# Patient Record
Sex: Female | Born: 1970 | Race: Black or African American | Hispanic: No | Marital: Single | Smoking: Never smoker
Health system: Southern US, Community
[De-identification: ages and names within clinical notes are randomized; demographics above are authoritative.]

## PROBLEM LIST (undated history)

## (undated) DIAGNOSIS — I671 Cerebral aneurysm, nonruptured: Secondary | ICD-10-CM

## (undated) DIAGNOSIS — I10 Essential (primary) hypertension: Secondary | ICD-10-CM

## (undated) HISTORY — DX: Cerebral aneurysm, nonruptured: I67.1

## (undated) HISTORY — DX: Essential (primary) hypertension: I10

## (undated) HISTORY — PX: BRAIN SURGERY: SHX531

---

## 2021-06-11 ENCOUNTER — Emergency Department (HOSPITAL_COMMUNITY): Payer: Self-pay

## 2021-06-11 ENCOUNTER — Encounter (HOSPITAL_COMMUNITY): Payer: Self-pay

## 2021-06-11 ENCOUNTER — Other Ambulatory Visit: Payer: Self-pay

## 2021-06-11 ENCOUNTER — Emergency Department (HOSPITAL_COMMUNITY)
Admission: EM | Admit: 2021-06-11 | Discharge: 2021-06-11 | Disposition: A | Discharge status: Home / Self Care | Payer: Self-pay | Attending: Student | Admitting: Student

## 2021-06-11 DIAGNOSIS — R519 Headache, unspecified: Secondary | ICD-10-CM | POA: Insufficient documentation

## 2021-06-11 MED ORDER — PROCHLORPERAZINE EDISYLATE 10 MG/2ML IJ SOLN
10.0000 mg | Freq: Once | INTRAMUSCULAR | Status: AC
  Administered 2021-06-11: 10 mg via INTRAMUSCULAR
  Filled 2021-06-11: qty 2

## 2021-06-11 MED ORDER — KETOROLAC TROMETHAMINE 15 MG/ML IJ SOLN: 15.0000 mg | Freq: Once | INTRAMUSCULAR | Status: DC

## 2021-06-11 MED ORDER — DIPHENHYDRAMINE HCL 50 MG/ML IJ SOLN
12.5000 mg | Freq: Once | INTRAMUSCULAR | Status: AC
  Administered 2021-06-11: 12.5 mg via INTRAMUSCULAR
  Filled 2021-06-11: qty 1

## 2021-06-11 NOTE — ED Provider Notes (Signed)
[facility]-EMERGENCY DEPT Provider Note   CSN: [id number] Arrival date & time: 06/11/21  1222     History Chief Complaint  Patient presents with  . Headache    Heidi Navarro is a 50 y.o. female with history of brain surgery in 2016 at [facility] who presents to the emergency room today for a constant left-sided headache that began yesterday.  She states that she suffers from headaches on a regular basis and she states that this feels similar to her typical headaches however it is much more severe.  She rates her headache an 8 out of 10 in severity.  She took some Tylenol which offered little improvement with her headache prior to arrival.  She denies any visual disturbances, weakness/numbness to her upper and lower extremities, loss of consciousness, nausea, vomiting, abdominal pain, chest pain, shortness of breath, diarrhea, leg pain, leg swelling.  The history is provided by the patient. No language interpreter was used.  Headache     History reviewed. No pertinent past medical history.  There are no problems to display for this patient.   Past Surgical History:  Procedure Laterality Date  . BRAIN SURGERY       OB History   No obstetric history on file.     History reviewed. No pertinent family history.  Social History   Tobacco Use  . Smoking status: Unknown    Home Medications Prior to Admission medications   Not on File    Allergies    Iodinated diagnostic agents, Iodine, Penicillins, and Shellfish allergy  Review of Systems   Review of Systems  Neurological:  Positive for headaches.  All other systems reviewed and are negative.  Physical Exam Updated Vital Signs BP 124/83   Pulse (!) 51   Temp 98.5 F (36.9 C)   Resp 18   SpO2 100%   Physical Exam Constitutional:      General: She is not in acute distress.    Appearance: Normal appearance.  HENT:     Head: Normocephalic and atraumatic.  Eyes:     General:        Right eye:  No discharge.        Left eye: No discharge.     Extraocular Movements: Extraocular movements intact.     Right eye: No nystagmus.     Left eye: No nystagmus.     Conjunctiva/sclera: Conjunctivae normal.     Pupils: Pupils are equal, round, and reactive to light.  Cardiovascular:     Comments: Regular rate and rhythm.  S1/S2 are distinct without any evidence of murmur, rubs, or gallops.  Radial pulses are 2+ bilaterally.  Dorsalis pedis pulses are 2+ bilaterally.  No evidence of pedal edema. Pulmonary:     Comments: Clear to auscultation bilaterally.  Normal effort.  No respiratory distress.  No evidence of wheezes, rales, or rhonchi heard throughout. Abdominal:     General: Abdomen is flat. Bowel sounds are normal. There is no distension.     Tenderness: There is no abdominal tenderness. There is no guarding or rebound.  Musculoskeletal:        General: Normal range of motion.     Cervical back: Neck supple.  Skin:    General: Skin is warm and dry.     Findings: No rash.  Neurological:     General: No focal deficit present.     Mental Status: She is alert.     Coordination: Romberg sign negative. Coordination normal.  Comments: Cranial nerves II through XII are intact.  5/5 strength to the upper and lower extremities.  Normal sensation to the upper and lower extremities.  Psychiatric:        Mood and Affect: Mood normal.        Behavior: Behavior normal.    ED Results / Procedures / Treatments   Labs (all labs ordered are listed, but only abnormal results are displayed) Labs Reviewed - No data to display  EKG None  Radiology CT HEAD WO CONTRAST ( )  Result Date: 06/11/2021 CLINICAL DATA:  Headache, chronic, new features or increased frequency. EXAM: CT HEAD WITHOUT CONTRAST TECHNIQUE: Contiguous axial images were obtained from the base of the skull through the vertex without intravenous contrast. COMPARISON:  No pertinent prior exams available for comparison. FINDINGS:  Brain: Streak and beam hardening artifact arising from embolization coil material in the left paraclinoid region. Cerebral volume is normal. There is no acute intracranial hemorrhage. No demarcated cortical infarct. No extra-axial fluid collection. No evidence of an intracranial mass. No midline shift. Vascular: Streak and beam hardening artifact arising from embolization coil material in the left paraclinoid region. Otherwise, no hyperdense vessel. Skull: Normal. Negative for fracture or focal lesion. Sinuses/Orbits: Visualized orbits show no acute finding. Chronic medially displaced fracture deformity of the right lamina papyracea. No significant paranasal sinus disease at the imaged levels. IMPRESSION: No evidence of acute intracranial abnormality. Streak and beam hardening artifact arising from embolization coil material in the left paraclinoid region. Correlate with the procedural history. Electronically Signed   By: Jackey Loge D.O.   On: 06/11/2021 14:13    Procedures Procedures   Medications Ordered in ED Medications  prochlorperazine (COMPAZINE) injection 10 mg (10 mg Intramuscular Given 06/11/21 1733)  diphenhydrAMINE (BENADRYL) injection 12.5 mg (12.5 mg Intramuscular Given 06/11/21 1733)    ED Course  I have reviewed the triage vital signs and the nursing notes.  Pertinent labs & imaging results that were available during my care of the patient were reviewed by me and considered in my medical decision making (see chart for details).  Clinical Course as of 06/11/21 1843  Wed Jun 11, 2021  1800 I discussed this case with my attending physician who cosigned this note including patient's presenting symptoms, physical exam, and planned diagnostics and interventions. Attending physician stated agreement with plan or made changes to plan which were implemented.      [CF]    Clinical Course User Index [CF] Jolyn Lent   MDM Rules/Calculators/A&P                           Heidi Navarro is a 50 y.o. female who presents to the emergency department today for for evaluation of headache.  Presentation is likely a benign headache.  She has no headache red flags.  Neurological exam is without evidence of meningismus, altered mental status, focal neurological findings so I believe this patient for meningitis, and encephalitis, and stroke.  Presentation is not consistent with acute intracranial bleed at this time.  No evidence of trauma.  CT head was ordered in triage which was normal.  Given the clinical scenario, she is much improved after migraine cocktail.  Strict return precautions given.  We will have her follow-up with her primary care provider within next week for further evaluation.  Final Clinical Impression(s) / ED Diagnoses Final diagnoses:  Nonintractable headache, unspecified chronicity pattern, unspecified headache type    Rx /  DC Orders ED Discharge Orders     None        Jolyn Lent 06/11/21 1843    Glendora Score, MD 06/11/21 [phone]

## 2021-06-11 NOTE — ED Triage Notes (Signed)
Headache x1 day worse upon standing   Hx of brain surgery 2016 with intermittent headaches since.

## 2021-06-11 NOTE — Discharge Instructions (Signed)
You were seen and evaluated in the emergency department for further evaluation of headache.  As we discussed, your imaging revealed no serious problems at the moment.  Please return to the emergency department if you experience worsening headache that is not typical for her typical headaches, weakness/numbness of your upper or lower extremities, fever that would not go away Tylenol or ibuprofen, slurred speech, loss of consciousness, or any other concerns you might have.  Please schedule an appointment with your neurologist within the next week.  If you are unable to schedule an appoint with them please follow-up with your primary care provider within the next week for further evaluation.

## 2021-06-11 NOTE — ED Provider Notes (Signed)
Emergency Medicine Provider Triage Evaluation Note  Heidi Navarro , a 50 y.o. female  was evaluated in triage.  Pt complains of headaches.  Patient reports a history of brain surgery at [facility] in 2016.  She states that since she has been experiencing intermittent headaches.  She states that she began experiencing a headache to the crown and left temple that began last night.  States it is worse than normal.  Describes her pain as stabbing.  No visual changes, numbness, weakness, chest pain, shortness of breath.  Physical Exam  BP 127/62   Pulse (!) 52   Temp 98.5 F (36.9 C)   Resp 18   SpO2 97%  Gen:   Awake, no distress   Resp:  Normal effort  MSK:   Moves extremities without difficulty  Other:    Medical Decision Making  Medically screening exam initiated at 12:59 PM.  Appropriate orders placed.  Blaise Candela was informed that the remainder of the evaluation will be completed by another provider, this initial triage assessment does not replace that evaluation, and the importance of remaining in the ED until their evaluation is complete.   Placido Sou, PA-C 06/11/21 1300    Edwin Dada P, DO 06/12/21 [phone]

## 2021-08-12 ENCOUNTER — Emergency Department (HOSPITAL_COMMUNITY)
Admission: EM | Admit: 2021-08-12 | Discharge: 2021-08-13 | Disposition: A | Discharge status: Home / Self Care | Payer: Self-pay | Attending: Emergency Medicine | Admitting: Emergency Medicine

## 2021-08-12 ENCOUNTER — Emergency Department (HOSPITAL_COMMUNITY): Payer: Self-pay

## 2021-08-12 ENCOUNTER — Other Ambulatory Visit: Payer: Self-pay

## 2021-08-12 ENCOUNTER — Encounter (HOSPITAL_COMMUNITY): Payer: Self-pay

## 2021-08-12 DIAGNOSIS — R0602 Shortness of breath: Secondary | ICD-10-CM | POA: Insufficient documentation

## 2021-08-12 DIAGNOSIS — R079 Chest pain, unspecified: Secondary | ICD-10-CM | POA: Insufficient documentation

## 2021-08-12 DIAGNOSIS — Z20822 Contact with and (suspected) exposure to covid-19: Secondary | ICD-10-CM | POA: Insufficient documentation

## 2021-08-12 DIAGNOSIS — R059 Cough, unspecified: Secondary | ICD-10-CM | POA: Insufficient documentation

## 2021-08-12 LAB — CBC WITH DIFFERENTIAL/PLATELET
Abs Immature Granulocytes: 0 10*3/uL (ref 0.00–0.07)
Basophils Absolute: 0 10*3/uL (ref 0.0–0.1)
Basophils Relative: 1 %
Eosinophils Absolute: 0.2 10*3/uL (ref 0.0–0.5)
Eosinophils Relative: 4 %
HCT: 38.5 % (ref 36.0–46.0)
Hemoglobin: 12.3 g/dL (ref 12.0–15.0)
Immature Granulocytes: 0 %
Lymphocytes Relative: 59 %
Lymphs Abs: 3.3 10*3/uL (ref 0.7–4.0)
MCH: 29.4 pg (ref 26.0–34.0)
MCHC: 31.9 g/dL (ref 30.0–36.0)
MCV: 91.9 fL (ref 80.0–100.0)
Monocytes Absolute: 0.2 10*3/uL (ref 0.1–1.0)
Monocytes Relative: 4 %
Neutro Abs: 1.8 10*3/uL (ref 1.7–7.7)
Neutrophils Relative %: 32 %
Platelets: 213 10*3/uL (ref 150–400)
RBC: 4.19 MIL/uL (ref 3.87–5.11)
RDW: 15.2 % (ref 11.5–15.5)
WBC: 5.6 10*3/uL (ref 4.0–10.5)
nRBC: 0 % (ref 0.0–0.2)

## 2021-08-12 LAB — COMPREHENSIVE METABOLIC PANEL
ALT: 18 U/L (ref 0–44)
AST: 18 U/L (ref 15–41)
Albumin: 4 g/dL (ref 3.5–5.0)
Alkaline Phosphatase: 99 U/L (ref 38–126)
Anion gap: 5 (ref 5–15)
BUN: 14 mg/dL (ref 6–20)
CO2: 28 mmol/L (ref 22–32)
Calcium: 8.9 mg/dL (ref 8.9–10.3)
Chloride: 108 mmol/L (ref 98–111)
Creatinine, Ser: 0.93 mg/dL (ref 0.44–1.00)
GFR, Estimated: >60 mL/min (ref >60)
Glucose, Bld: 98 mg/dL (ref 70–99)
Potassium: 3.6 mmol/L (ref 3.5–5.1)
Sodium: 141 mmol/L (ref 135–145)
Total Bilirubin: 0.4 mg/dL (ref 0.3–1.2)
Total Protein: 7.8 g/dL (ref 6.5–8.1)

## 2021-08-12 LAB — LIPASE, BLOOD: Lipase: 19 U/L (ref 11–51)

## 2021-08-12 LAB — TROPONIN I (HIGH SENSITIVITY)
Troponin I (High Sensitivity): <2 ng/L (ref <18)
Troponin I (High Sensitivity): <2 ng/L (ref <18)

## 2021-08-12 LAB — HCG, QUANTITATIVE, PREGNANCY: hCG, Beta Chain, Quant, S: 3 m[IU]/mL (ref <5)

## 2021-08-12 LAB — BRAIN NATRIURETIC PEPTIDE: B Natriuretic Peptide: 104.6 pg/mL — ABNORMAL HIGH (ref 0.0–100.0)

## 2021-08-12 LAB — D-DIMER, QUANTITATIVE: D-Dimer, Quant: 1.72 ug/mL-FEU — ABNORMAL HIGH (ref 0.00–0.50)

## 2021-08-12 MED ORDER — DIPHENHYDRAMINE HCL 25 MG PO CAPS: 50.0000 mg | ORAL_CAPSULE | Freq: Once | ORAL | Status: DC

## 2021-08-12 MED ORDER — HYDROCORTISONE SOD SUC (PF) 250 MG IJ SOLR
200.0000 mg | Freq: Once | INTRAMUSCULAR | Status: AC
  Administered 2021-08-12: 200 mg via INTRAVENOUS
  Filled 2021-08-12: qty 200

## 2021-08-12 MED ORDER — DIPHENHYDRAMINE HCL 50 MG/ML IJ SOLN: 50.0000 mg | Freq: Once | INTRAMUSCULAR | Status: DC

## 2021-08-12 MED ORDER — DIPHENHYDRAMINE HCL 50 MG/ML IJ SOLN
50.0000 mg | Freq: Once | INTRAMUSCULAR | Status: AC
  Administered 2021-08-12: 50 mg via INTRAVENOUS
  Filled 2021-08-12: qty 1

## 2021-08-12 MED ORDER — DIPHENHYDRAMINE HCL 25 MG PO CAPS: 50.0000 mg | ORAL_CAPSULE | Freq: Once | ORAL | Status: AC

## 2021-08-12 NOTE — ED Provider Notes (Signed)
Emergency Medicine Provider Triage Evaluation Note  Heidi Navarro , a 50 y.o. female  was evaluated in triage.  Pt complains of shortness of breath that has been worse over the last 2 days.  She states that her CPAP machine has not been working properly and her daughter's noticed that she has been struggling to breathe.  Shortness of breath is primarily with exertion.  Also having some chest pain yesterday.  None today.  Reports associated leg swelling.  Review of Systems  Positive:  Negative: Fever, chills   Physical Exam  BP (!) 149/77 (BP Location: Left Arm)   Pulse (!) 59   Temp 97.8 F (36.6 C) (Oral)   Resp 20   SpO2 100%  Gen:   Awake, no distress   Resp:  Normal effort  MSK:   Moves extremities without difficulty  Other:  1+ pitting edema bilaterally up to the proximal shin  Medical Decision Making  Medically screening exam initiated at 4:38 PM.  Appropriate orders placed.  Sofiya Tayler was informed that the remainder of the evaluation will be completed by another provider, this initial triage assessment does not replace that evaluation, and the importance of remaining in the ED until their evaluation is complete.     Honor Loh Liberal, PA-C 08/12/21 1640    Mancel Bale, MD 08/13/21 [phone]

## 2021-08-12 NOTE — ED Triage Notes (Signed)
Pt c/o shortness of breath that started today. Pt thinks it may be due to her cpap machine not working properly last night.

## 2021-08-12 NOTE — ED Provider Notes (Signed)
[facility] DEPT Provider Note   CSN: [id number] Arrival date & time: 08/12/21  1614     History Chief Complaint  Patient presents with   Shortness of Breath    Heidi Navarro is a 50 y.o. female  Presents emergency department with a chief plaint of shortness of breath and chest pain.  Patient reports that she has had shortness of breath constantly over the last 2 days.  Shortness of breath has been worse with exertion.  Patient reports that she has been wheezing over this time as well.  Patient reports that her CPAP patient has not been working properly and she is concerned this may be causing her shortness of breath.  Patient reports that she has had chest pain intermittently over the last 3 days.  Chest pain is midsternal and does not radiate.  Patient describes pain as sharp.  Patient denies any aggravating factors.  Reports that pain has come on randomly.  No associated nausea, vomiting, diaphoresis, shortness of breath, lightheadedness, dizziness, syncope.  Patient endorses nonproductive cough and rhinorrhea.  Patient denies any fever, chills, palpitations, orthopnea, leg swelling or tenderness, abdominal pain, nausea, vomiting, hemoptysis, history of PE/DVT, history of cancer, surgery last 4 weeks, hormone therapy.     Shortness of Breath Associated symptoms: cough   Associated symptoms: no abdominal pain, no chest pain, no fever, no headaches, no neck pain, no rash, no sore throat and no vomiting       History reviewed. No pertinent past medical history.  There are no problems to display for this patient.   Past Surgical History:  Procedure Laterality Date   BRAIN SURGERY       OB History   No obstetric history on file.     No family history on file.  Social History   Tobacco Use   Smoking status: Unknown    Home Medications Prior to Admission medications   Not on File    Allergies    Iodinated diagnostic agents,  Iodine, Penicillins, and Shellfish allergy  Review of Systems   Review of Systems  Constitutional:  Negative for chills and fever.  HENT:  Positive for rhinorrhea. Negative for congestion and sore throat.   Eyes:  Negative for visual disturbance.  Respiratory:  Positive for cough and shortness of breath.   Cardiovascular:  Negative for chest pain.  Gastrointestinal:  Negative for abdominal pain, diarrhea, nausea and vomiting.  Genitourinary:  Negative for difficulty urinating, dysuria, hematuria and urgency.  Musculoskeletal:  Negative for back pain and neck pain.  Skin:  Negative for color change and rash.  Neurological:  Negative for dizziness, syncope, light-headedness and headaches.  Psychiatric/Behavioral:  Negative for confusion.    Physical Exam Updated Vital Signs BP (!) 149/77 (BP Location: Left Arm)   Pulse (!) 59   Temp 97.8 F (36.6 C) (Oral)   Resp 20   SpO2 100%   Physical Exam Vitals and nursing note reviewed.  Constitutional:      General: She is not in acute distress.    Appearance: She is morbidly obese. She is not ill-appearing, toxic-appearing or diaphoretic.  HENT:     Head: Normocephalic.  Eyes:     General: No scleral icterus.       Right eye: No discharge.        Left eye: No discharge.     Conjunctiva/sclera: Conjunctivae normal.  Cardiovascular:     Rate and Rhythm: Normal rate.  Pulmonary:     Effort:  Pulmonary effort is normal. No tachypnea, bradypnea or respiratory distress.     Breath sounds: Normal breath sounds. No stridor. No wheezing, rhonchi or rales.     Comments: Speaks in full complete sentences without difficulty Chest:     Chest wall: Tenderness present. No mass, lacerations, deformity, swelling or crepitus.     Comments: Chest pain reproduced with palpation Abdominal:     General: Abdomen is protuberant. There is no distension. There are no signs of injury.     Palpations: Abdomen is soft. There is no mass or pulsatile mass.      Tenderness: There is no abdominal tenderness. There is no guarding or rebound.  Musculoskeletal:     Cervical back: Neck supple.     Right lower leg: No swelling, deformity, lacerations, tenderness or bony tenderness. No edema.     Left lower leg: No swelling, deformity, lacerations, tenderness or bony tenderness. No edema.  Skin:    General: Skin is warm and dry.  Neurological:     General: No focal deficit present.     Mental Status: She is alert.  Psychiatric:        Behavior: Behavior is cooperative.    ED Results / Procedures / Treatments   Labs (all labs ordered are listed, but only abnormal results are displayed) Labs Reviewed  BRAIN NATRIURETIC PEPTIDE - Abnormal; Notable for the following components:      Result Value   B Natriuretic Peptide 104.6 (*)    All other components within normal limits  D-DIMER, QUANTITATIVE - Abnormal; Notable for the following components:   D-Dimer, Quant 1.72 (*)    All other components within normal limits  COMPREHENSIVE METABOLIC PANEL  CBC WITH DIFFERENTIAL/PLATELET  LIPASE, BLOOD  HCG, QUANTITATIVE, PREGNANCY  TROPONIN I (HIGH SENSITIVITY)  TROPONIN I (HIGH SENSITIVITY)    EKG EKG Interpretation  Date/Time:  Tuesday August 12 2021 16:26:47 EST Ventricular Rate:  57 PR Interval:  168 QRS Duration: 80 QT Interval:  423 QTC Calculation: 412 R Axis:   57 Text Interpretation: Sinus rhythm Normal ECG No previous tracing Confirmed by Gwyneth Sprout ([id number]) on 08/12/2021 7:30:24 PM  Radiology DG Chest 2 View  Result Date: 08/12/2021 CLINICAL DATA:  Shortness of breath EXAM: CHEST - 2 VIEW COMPARISON:  None. FINDINGS: The heart size and mediastinal contours are within normal limits. Both lungs are clear. The visualized skeletal structures are unremarkable. IMPRESSION: No active cardiopulmonary disease. Electronically Signed   By: Jasmine Pang M.D.   On: 08/12/2021 17:04    Procedures Procedures   Medications Ordered in  ED Medications  diphenhydrAMINE (BENADRYL) capsule 50 mg (has no administration in time range)    Or  diphenhydrAMINE (BENADRYL) injection 50 mg (has no administration in time range)  hydrocortisone sodium succinate (SOLU-CORTEF) injection 200 mg (200 mg Intravenous Given 08/12/21 2021)    ED Course  I have reviewed the triage vital signs and the nursing notes.  Pertinent labs & imaging results that were available during my care of the patient were reviewed by me and considered in my medical decision making (see chart for details).    MDM Rules/Calculators/A&P                           Alert 50 year old female no acute distress, nontoxic-appearing.  Presents to ED with chief complaint of shortness of breath and chest pain.  Reports that she has had intermittent chest pain over the last 3 days.  Chest pain comes on randomly and is not brought on by any aggravating factors.  Patient has not tried any allergies to alleviate her symptoms.  Shortness of breath has been constant over the last 2 days.  Patient reports that she feels a she has had some wheezing.  Lungs clear to auscultation bilaterally.  Patient able to speak in full complete sentences without difficulty.  Oxygen saturation 100% on room air.  Patient has reproduction of chest pain with palpation to mid sternum.  Will obtain ACS work-up, BMP, and D-dimer to look for possible cause of patient's chest pain and shortness of breath.  EKG shows normal sinus rhythm Chest x-ray shows no active cardiopulmonary disease Revealed troponins unremarkable BNP slightly elevated at 104.6 Low suspicion for ACS or acute CHF as cause of patient's chest pain shortness of breath at this time.  CMP, CBC, and lipase are unremarkable.  D-dimer found to be elevated at 1.72.  Previous documented D-dimer had been within normal limits.  Will obtain CTA to evaluate for possible PE.  Patient does have allergy to contrast dye and will require steroids and  Benadryl prior to CTA.  CTA pending at this time.  Give shows no PE patient able to stand and ambulate without difficulty plan to discharge home with PCP follow-up.  Patient care transferred to [facility] at the end of my shift. Patient presentation, ED course, and plan of care discussed with review of all pertinent labs and imaging. Please see his/her note for further details regarding further ED course and disposition.   Final Clinical Impression(s) / ED Diagnoses Final diagnoses:  None    Rx / DC Orders ED Discharge Orders     None        Dyann Ruddle 08/12/21 2329    Blanchie Dessert, MD 08/14/21 [phone]

## 2021-08-13 ENCOUNTER — Encounter (HOSPITAL_COMMUNITY): Payer: Self-pay

## 2021-08-13 ENCOUNTER — Emergency Department (HOSPITAL_COMMUNITY): Payer: Self-pay

## 2021-08-13 LAB — RESP PANEL BY RT-PCR (FLU A&B, COVID) ARPGX2
Influenza A by PCR: NEGATIVE
Influenza B by PCR: NEGATIVE
SARS Coronavirus 2 by RT PCR: NEGATIVE

## 2021-08-13 MED ORDER — IOHEXOL 350 MG/ML SOLN
80.0000 mL | Freq: Once | INTRAVENOUS | Status: AC | PRN
  Administered 2021-08-13: 80 mL via INTRAVENOUS

## 2021-08-13 MED ORDER — ALBUTEROL SULFATE HFA 108 (90 BASE) MCG/ACT IN AERS
2.0000 | INHALATION_SPRAY | Freq: Once | RESPIRATORY_TRACT | Status: AC
  Administered 2021-08-13: 2 via RESPIRATORY_TRACT
  Filled 2021-08-13: qty 6.7

## 2021-08-13 MED ORDER — FLUTICASONE PROPIONATE 50 MCG/ACT NA SUSP: 1.0000 | Freq: Every day | NASAL | 0 refills | Status: AC

## 2021-08-13 MED ORDER — BENZONATATE 100 MG PO CAPS: 100.0000 mg | ORAL_CAPSULE | Freq: Three times a day (TID) | ORAL | 0 refills | Status: AC

## 2021-08-13 NOTE — Discharge Instructions (Addendum)
You were seen in the ER today for trouble breathing with chest pain.  Your work-up was overall reassuring.  Your CT scan did not show findings of a blood clot.  Your heart enzymes did not have findings of a heart attack.  We are sending you home with the following medicines to try to help with your symptoms - Flonase: Use 1 spray per nostril daily as needed for congestion - Tessalon: Take 1 tablet as needed every 8 hours for cough. - Albuterol inhaler: Use 1 to 2 puffs every 4-6 hours as needed for trouble breathing/wheezing  We have prescribed you new medication(s) today. Discuss the medications prescribed today with your pharmacist as they can have adverse effects and interactions with your other medicines including over the counter and prescribed medications. Seek medical evaluation if you start to experience new or abnormal symptoms after taking one of these medicines, seek care immediately if you start to experience difficulty breathing, feeling of your throat closing, facial swelling, or rash as these could be indications of a more serious allergic reaction  Please follow-up with primary care within 3 days.  Return to the emergency department for new or worsening symptoms including but not limited to new or worsening pain, fever, increased work of breathing, coughing up blood, passing out, or any other concerns.

## 2021-08-13 NOTE — ED Provider Notes (Signed)
23:30: Assumed care of patient from PA Badalamente pending CTA and disposition.   Please see prior provider note for full H&P.  Briefly patient is a 50 yo female who presented with dyspnea & chest pain. Also has had rhinorrhea, non productive cough and some reports of wheezing.   Patient's work-up has been reviewed: CBC, CMP, lipase, pregnancy test, troponins, COVID/flu testing: Negative BMP: Minimally elevated D-dimer: Elevated  CT angio: No evidence of pulmonary embolism. Mild dependent atelectatic changes  EKG without acute stemi, trops flat- doubt ACS.  CTA negative for PE, fluid overload, or pneumonia.  Unclear definitive etiology of patient's sxs, however she is not hypoxic or in respiratory distress, overall seems reasonable for discharge. Has had congestion & cough. Possibly viral vs. Allergic sxs. Covid/flu negative. Patient reports wheezing at home, lungs clear in the ED. Will provide supportive care with PCP follow up. I discussed results, treatment plan, need for follow-up, and return precautions with the patient. Provided opportunity for questions, patient confirmed understanding and is in agreement with plan.    Results for orders placed or performed during the hospital encounter of 08/12/21  Resp Panel by RT-PCR (Flu A&B, Covid) Nasopharyngeal Swab   Specimen: Nasopharyngeal Swab; Nasopharyngeal(NP) swabs in vial transport medium  Result Value Ref Range   SARS Coronavirus 2 by RT PCR NEGATIVE NEGATIVE   Influenza A by PCR NEGATIVE NEGATIVE   Influenza B by PCR NEGATIVE NEGATIVE  Comprehensive metabolic panel  Result Value Ref Range   Sodium 141 135 - 145 mmol/L   Potassium 3.6 3.5 - 5.1 mmol/L   Chloride 108 98 - 111 mmol/L   CO2 28 22 - 32 mmol/L   Glucose, Bld 98 70 - 99 mg/dL   BUN 14 6 - 20 mg/dL   Creatinine, Ser 5.46 0.44 - 1.00 mg/dL   Calcium 8.9 8.9 - 56.8 mg/dL   Total Protein 7.8 6.5 - 8.1 g/dL   Albumin 4.0 3.5 - 5.0 g/dL   AST 18 15 - 41 U/L   ALT 18 0  - 44 U/L   Alkaline Phosphatase 99 38 - 126 U/L   Total Bilirubin 0.4 0.3 - 1.2 mg/dL   GFR, Estimated >12 >75 mL/min   Anion gap 5 5 - 15  CBC with Differential  Result Value Ref Range   WBC 5.6 4.0 - 10.5 K/uL   RBC 4.19 3.87 - 5.11 MIL/uL   Hemoglobin 12.3 12.0 - 15.0 g/dL   HCT 17.0 01.7 - 49.4 %   MCV 91.9 80.0 - 100.0 fL   MCH 29.4 26.0 - 34.0 pg   MCHC 31.9 30.0 - 36.0 g/dL   RDW 49.6 75.9 - 16.3 %   Platelets 213 150 - 400 K/uL   nRBC 0.0 0.0 - 0.2 %   Neutrophils Relative % 32 %   Neutro Abs 1.8 1.7 - 7.7 K/uL   Lymphocytes Relative 59 %   Lymphs Abs 3.3 0.7 - 4.0 K/uL   Monocytes Relative 4 %   Monocytes Absolute 0.2 0.1 - 1.0 K/uL   Eosinophils Relative 4 %   Eosinophils Absolute 0.2 0.0 - 0.5 K/uL   Basophils Relative 1 %   Basophils Absolute 0.0 0.0 - 0.1 K/uL   Immature Granulocytes 0 %   Abs Immature Granulocytes 0.00 0.00 - 0.07 K/uL  Brain natriuretic peptide  Result Value Ref Range   B Natriuretic Peptide 104.6 (H) 0.0 - 100.0 pg/mL  D-dimer, quantitative  Result Value Ref Range   D-Dimer, Quant  1.72 (H) 0.00 - 0.50 ug/mL-FEU  Lipase, blood  Result Value Ref Range   Lipase 19 11 - 51 U/L  hCG, quantitative, pregnancy  Result Value Ref Range   hCG, Beta Chain, Quant, S 3 <5 mIU/mL  Troponin I (High Sensitivity)  Result Value Ref Range   Troponin I (High Sensitivity) <2 <18 ng/L  Troponin I (High Sensitivity)  Result Value Ref Range   Troponin I (High Sensitivity) <2 <18 ng/L   DG Chest 2 View  Result Date: 08/12/2021 CLINICAL DATA:  Shortness of breath EXAM: CHEST - 2 VIEW COMPARISON:  None. FINDINGS: The heart size and mediastinal contours are within normal limits. Both lungs are clear. The visualized skeletal structures are unremarkable. IMPRESSION: No active cardiopulmonary disease. Electronically Signed   By: Jasmine Pang M.D.   On: 08/12/2021 17:04   CT Angio Chest PE W and/or Wo Contrast  Result Date: 08/13/2021 CLINICAL DATA:  Shortness  of breath for 2 days, negative COVID test, initial encounter EXAM: CT ANGIOGRAPHY CHEST WITH CONTRAST TECHNIQUE: Multidetector CT imaging of the chest was performed using the standard protocol during bolus administration of intravenous contrast. Multiplanar CT image reconstructions and MIPs were obtained to evaluate the vascular anatomy. CONTRAST:  20mL OMNIPAQUE IOHEXOL 350 MG/ML SOLN COMPARISON:  Chest x-ray from the previous day. FINDINGS: Cardiovascular: Thoracic aorta shows no aneurysmal dilatation. No cardiac enlargement is noted. Pulmonary artery shows no branching pattern bilaterally. No definitive filling defect to suggest pulmonary embolism is noted. Mediastinum/Nodes: Thoracic inlet is within normal limits. No sizable hilar or mediastinal adenopathy is noted. The esophagus as visualized shows a small sliding-type hiatal hernia. Lungs/Pleura: Mild dependent atelectatic changes are seen. No focal confluent infiltrate is noted. Upper Abdomen: Visualized upper abdomen is within normal limits. Musculoskeletal: Degenerative changes of the thoracic spine are seen. No rib abnormality is noted. Review of the MIP images confirms the above findings. IMPRESSION: No evidence of pulmonary embolism. Mild dependent atelectatic changes. Electronically Signed   By: Alcide Clever M.D.   On: 08/13/2021 00:57      Marifer Hurd, Pleas Koch, PA-C 08/13/21 0128    Geoffery Lyons, MD 08/13/21 [phone]

## 2021-08-19 ENCOUNTER — Telehealth: Payer: Self-pay

## 2021-08-19 NOTE — Telephone Encounter (Signed)
Copied from CRM [id number]. Topic: General - Other >> Aug 13, 2021 12:29 PM Jaquita Rector A wrote: Reason for CRM: Patient called in needing to speak to Laural Benes and Mikle Bosworth would like to get a better understanding of how the program works for her to be able to see the Dr without having to pay out of pocket and also being able to see the neurologist on a regular basis. Please call Ph# [phone]

## 2021-08-21 NOTE — Telephone Encounter (Signed)
I return Pt call, LVM explaining her how the program works

## 2021-09-02 ENCOUNTER — Telehealth: Payer: Self-pay

## 2021-09-02 NOTE — Telephone Encounter (Signed)
I return Pt call, LVM inform that she need to see the provider first before she can apply for the financial program @  clinic also that the [facility] Card does not cover hospital or medical bills but is she apply for the [facility] financial they can help, again she need to see the Provider first that is the rule of the clinic

## 2021-09-02 NOTE — Telephone Encounter (Signed)
Copied from CRM [id number]. Topic: Appointment Scheduling - Scheduling Inquiry for Clinic >> Sep 02, 2021 12:26 PM Pawlus, Gifford Shave wrote: Reason for CRM: Pt needed an appt for financial assistance and paying medical bills, pt has new pt appt on 10/24/21 but wanted some financial advice as well.

## 2021-09-08 ENCOUNTER — Telehealth: Payer: Self-pay | Admitting: *Deleted

## 2021-09-08 NOTE — Telephone Encounter (Signed)
Copied from CRM [id number]. Topic: General - Other >> Sep 08, 2021 12:53 PM Marylen Ponto wrote: Reason for CRM: Pt returned call to [facility]. Pt asked that Mikle Bosworth return her call at [phone]  CALLED PT BACK, NO ANSWER

## 2021-10-24 ENCOUNTER — Ambulatory Visit: Payer: Self-pay | Admitting: Internal Medicine

## 2022-04-10 ENCOUNTER — Encounter (HOSPITAL_COMMUNITY): Payer: Self-pay

## 2022-04-10 ENCOUNTER — Other Ambulatory Visit: Payer: Self-pay

## 2022-04-10 ENCOUNTER — Emergency Department (HOSPITAL_COMMUNITY)
Admission: EM | Admit: 2022-04-10 | Discharge: 2022-04-10 | Disposition: A | Discharge status: Home / Self Care | Payer: Self-pay | Attending: Emergency Medicine | Admitting: Emergency Medicine

## 2022-04-10 ENCOUNTER — Emergency Department (HOSPITAL_COMMUNITY): Payer: Self-pay

## 2022-04-10 DIAGNOSIS — R079 Chest pain, unspecified: Secondary | ICD-10-CM

## 2022-04-10 DIAGNOSIS — R519 Headache, unspecified: Secondary | ICD-10-CM | POA: Insufficient documentation

## 2022-04-10 DIAGNOSIS — R0789 Other chest pain: Secondary | ICD-10-CM | POA: Insufficient documentation

## 2022-04-10 LAB — BASIC METABOLIC PANEL
Anion gap: 6 (ref 5–15)
BUN: 12 mg/dL (ref 6–20)
CO2: 25 mmol/L (ref 22–32)
Calcium: 9.1 mg/dL (ref 8.9–10.3)
Chloride: 111 mmol/L (ref 98–111)
Creatinine, Ser: 1.1 mg/dL — ABNORMAL HIGH (ref 0.44–1.00)
GFR, Estimated: >60 mL/min (ref >60)
Glucose, Bld: 113 mg/dL — ABNORMAL HIGH (ref 70–99)
Potassium: 3.4 mmol/L — ABNORMAL LOW (ref 3.5–5.1)
Sodium: 142 mmol/L (ref 135–145)

## 2022-04-10 LAB — CBC
HCT: 37.2 % (ref 36.0–46.0)
Hemoglobin: 12 g/dL (ref 12.0–15.0)
MCH: 29.2 pg (ref 26.0–34.0)
MCHC: 32.3 g/dL (ref 30.0–36.0)
MCV: 90.5 fL (ref 80.0–100.0)
Platelets: 220 10*3/uL (ref 150–400)
RBC: 4.11 MIL/uL (ref 3.87–5.11)
RDW: 15.2 % (ref 11.5–15.5)
WBC: 5.8 10*3/uL (ref 4.0–10.5)
nRBC: 0 % (ref 0.0–0.2)

## 2022-04-10 LAB — TROPONIN I (HIGH SENSITIVITY): Troponin I (High Sensitivity): <2 ng/L (ref <18)

## 2022-04-10 MED ORDER — PANTOPRAZOLE SODIUM 20 MG PO TBEC: 20.0000 mg | DELAYED_RELEASE_TABLET | Freq: Every day | ORAL | 0 refills | Status: AC

## 2022-04-10 MED ORDER — PANTOPRAZOLE SODIUM 20 MG PO TBEC
20.0000 mg | DELAYED_RELEASE_TABLET | Freq: Once | ORAL | Status: AC
  Administered 2022-04-10: 20 mg via ORAL
  Filled 2022-04-10: qty 1

## 2022-04-10 MED ORDER — SUCRALFATE 1 G PO TABS: 1.0000 g | ORAL_TABLET | Freq: Three times a day (TID) | ORAL | 0 refills | Status: AC

## 2022-04-10 NOTE — ED Provider Notes (Signed)
Heidi Navarro Provider Note   CSN: [id number] Arrival date & time: 04/10/22  1647     History  Chief Complaint  Patient presents with   Back Pain   Chest Pain   Hypertension    Heidi Navarro is a 51 y.o. female.  Patient here primarily with chest discomfort.  Reflux type symptoms when she eats.  Has no abdominal pain.  She has been having some headaches but none currently.  She wants the have her head looked at because she has a history of aneurysm.  She states that she had her aneurysm looked at recently with some imaging with her neurosurgeon about a month ago that was normal.  She has no current chest pain, abdominal pain, headache, shortness of breath at this time.  She states that every time she eats it makes her symptoms worse.  Denies any infectious symptoms.  The history is provided by the patient.       Home Medications Prior to Admission medications   Medication Sig Start Date End Date Taking? Authorizing Provider  pantoprazole (PROTONIX) 20 MG tablet Take 1 tablet (20 mg total) by mouth daily. 04/10/22 05/10/22 Yes Heidi Giraldo, DO  sucralfate (CARAFATE) 1 g tablet Take 1 tablet (1 g total) by mouth 4 (four) times daily -  with meals and at bedtime for 14 days. 04/10/22 04/24/22 Yes Heidi Cerone, DO  atenolol (TENORMIN) 50 MG tablet Take 1 tablet by mouth daily. 04/14/21   Heidi Navarro  benzonatate (TESSALON) 100 MG capsule Take 1 capsule (100 mg total) by mouth every 8 (eight) hours. 08/13/21   Heidi Navarro, Heidi R, PA-C  fluticasone (FLONASE) 50 MCG/ACT nasal spray Place 1 spray into both nostrils daily. 08/13/21   Heidi Navarro, Heidi R, PA-C  hydrochlorothiazide (HYDRODIURIL) 25 MG tablet Take 0.5 tablets by mouth daily. 04/17/21   Heidi Navarro      Allergies    Iodinated contrast media, Iodine, Penicillins, and Shellfish allergy    Review of Systems   Review of Systems  Physical Exam Updated Vital  Signs BP (!) 156/87   Pulse (!) 59   Temp 98 F (36.7 C) (Oral)   Resp 20   Ht 5\' 5"  (1.651 m)   Wt 136.1 kg   SpO2 100%   BMI 49.92 kg/m  Physical Exam Vitals and nursing note reviewed.  Constitutional:      General: She is not in acute distress.    Appearance: She is well-developed.  HENT:     Head: Normocephalic and atraumatic.  Eyes:     Extraocular Movements: Extraocular movements intact.     Conjunctiva/sclera: Conjunctivae normal.     Pupils: Pupils are equal, round, and reactive to light.  Cardiovascular:     Rate and Rhythm: Normal rate and regular rhythm.     Pulses:          Radial pulses are 2+ on the right side and 2+ on the left side.     Heart sounds: No murmur heard. Pulmonary:     Effort: Pulmonary effort is normal. No respiratory distress.     Breath sounds: Normal breath sounds.  Abdominal:     Palpations: Abdomen is soft.     Tenderness: There is no abdominal tenderness.  Musculoskeletal:        General: No swelling. Normal range of motion.     Cervical back: Neck supple.  Skin:    General: Skin is warm and dry.  Capillary Refill: Capillary refill takes less than 2 seconds.  Neurological:     Mental Status: She is alert.     Comments: 5+ out of 5 strength throughout, normal sensation, no drift, normal finger-nose-finger, normal speech  Psychiatric:        Mood and Affect: Mood normal.     ED Results / Procedures / Treatments   Labs (all labs ordered are listed, but only abnormal results are displayed) Labs Reviewed  BASIC METABOLIC PANEL - Abnormal; Notable for the following components:      Result Value   Potassium 3.4 (*)    Glucose, Bld 113 (*)    Creatinine, Ser 1.10 (*)    All other components within normal limits  CBC  TROPONIN I (HIGH SENSITIVITY)    EKG EKG Interpretation  Date/Time:  Friday April 10 2022 18:11:34 EDT Ventricular Rate:  81 PR Interval:  152 QRS Duration: 76 QT Interval:  384 QTC Calculation: 446 Navarro  Axis:   41 Text Interpretation: Sinus rhythm Confirmed by Heidi Navarro ([id number]) on 04/10/2022 8:12:47 PM  Radiology CT HEAD WO CONTRAST ( )  Result Date: 04/10/2022 CLINICAL DATA:  New or worsening headache.  Left chest pain EXAM: CT HEAD WITHOUT CONTRAST TECHNIQUE: Contiguous axial images were obtained from the base of the skull through the vertex without intravenous contrast. RADIATION DOSE REDUCTION: This exam was performed according to the departmental dose-optimization program which includes automated exposure control, adjustment of the mA and/or kV according to patient size and/or use of iterative reconstruction technique. COMPARISON:  06/11/2021 FINDINGS: Brain: No evidence of acute infarction, hemorrhage, hydrocephalus, extra-axial collection or mass lesion/mass effect. Vascular: Metallic structure with streak artifact demonstrated in the left parasellar region consistent with embolization coils. Skull: Calvarium appears intact. Sinuses/Orbits: Paranasal sinuses and mastoid air cells are clear. Old fracture deformity of the right medial orbital wall. Other: No significant change since prior study. IMPRESSION: No acute intracranial abnormalities. Aneurysm coils demonstrated in the region of the left middle cerebral artery origin. Old fracture deformity of the right medial orbital wall. No change since prior study. Electronically Signed   By: Heidi Navarro M.D.   On: 04/10/2022 21:24   DG Chest Port 1 View  Result Date: 04/10/2022 CLINICAL DATA:  Chest pain. EXAM: PORTABLE CHEST 1 VIEW COMPARISON:  08/12/2021 FINDINGS: The heart size and mediastinal contours are within normal limits. Lungs are suboptimally inflated. Both lungs are clear. The visualized skeletal structures are unremarkable. IMPRESSION: No active disease. Electronically Signed   By: Heidi Navarro M.D.   On: 04/10/2022 18:24    Procedures Procedures    Medications Ordered in ED Medications  pantoprazole (PROTONIX) EC tablet  20 mg (has no administration in time range)    ED Course/ Medical Decision Making/ A&P                           Medical Decision Making Amount and/or Complexity of Data Reviewed Radiology: ordered.  Risk Prescription drug management.   Heidi Navarro is here with primarily chest pain.  History of cerebral aneurysm status posttreatment.  She has been having some mild headaches here and there recently.  She would like a head CT.  She has no current headache.  No neurologic symptoms.  She states that she recently had her aneurysm evaluated by her surgeon and was unremarkable.  Shared decision was made to get a head CT.  Otherwise it sounds like she has reflux type symptoms.  Differential diagnosis is likely reflux.  Will check EKG troponin and chest x-ray to further evaluate for ACS.  I have no concern for PE.  CBC, BMP, troponin, chest x-ray also obtained.  Per my review interpretation of labs is no significant anemia, electrolyte abnormality, kidney injury, leukocytosis.  Troponin is normal.  Chest x-ray without signs of pneumonia or pneumothorax.  She is not having any abdominal pain.  Have no concern for cholecystitis or pancreatitis.  She states that she mostly gets symptoms when she eats.  Overall we will prescribe Protonix and Carafate.  CT of the head was unremarkable.  Discharged in good condition.  This chart was dictated using voice recognition software.  Despite best efforts to proofread,  errors can occur which can change the documentation meaning.         Final Clinical Impression(s) / ED Diagnoses Final diagnoses:  Nonspecific chest pain  Nonintractable headache, unspecified chronicity pattern, unspecified headache type    Rx / DC Orders ED Discharge Orders          Ordered    pantoprazole (PROTONIX) 20 MG tablet  Daily        04/10/22 2135    sucralfate (CARAFATE) 1 g tablet  3 times daily with meals & bedtime        04/10/22 2135              Heidi Norfolk, DO 04/10/22 2135

## 2022-04-10 NOTE — ED Triage Notes (Signed)
Patient reports that she has pain from the posterior neck to the lumbar area and pain then radiates down the left leg x 2 days.  Patient added that she had left chest pain yesterday and once today. Patient denies SOB. Patient states that she wants this checked because she had a cerebral aneurysm.

## 2022-04-10 NOTE — ED Notes (Signed)
Pt. Unable to sign for discharge due to pad not working. Pt. Received paperwork and showed verbal understanding of discharge information.

## 2022-04-10 NOTE — ED Provider Triage Note (Signed)
Emergency Medicine Provider Triage Evaluation Note  Heidi Navarro , a 51 y.o. female  was evaluated in triage.  Pt complains of chest pain, back pain, headache x2 days. Taken tylenol with improvement in symptoms. She feels a flutter to her chest that's waxing and wanning. She is concerned about headache due to prior hx of aneurysm. No prior CAD.   Review of Systems  Positive: Chest, back, headache Negative: Sob, fever, cough, vision changes  Physical Exam  BP 140/76   Pulse 82   Temp 98 F (36.7 C) (Oral)   Resp 18   Ht 5\' 5"  (1.651 m)   Wt 136.1 kg   SpO2 96%   BMI 49.92 kg/m  Gen:   Awake, no distress   Resp:  Normal effort  MSK:   Moves extremities without difficulty  Other:    Medical Decision Making  Medically screening exam initiated at 5:50 PM.  Appropriate orders placed.  Heidi Navarro was informed that the remainder of the evaluation will be completed by another provider, this initial triage assessment does not replace that evaluation, and the importance of remaining in the ED until their evaluation is complete.     , PA-C 04/10/22 1752

## 2023-01-17 ENCOUNTER — Encounter (HOSPITAL_BASED_OUTPATIENT_CLINIC_OR_DEPARTMENT_OTHER): Payer: Self-pay | Admitting: Emergency Medicine

## 2023-01-17 ENCOUNTER — Other Ambulatory Visit: Payer: Self-pay

## 2023-01-17 ENCOUNTER — Emergency Department (HOSPITAL_BASED_OUTPATIENT_CLINIC_OR_DEPARTMENT_OTHER): Payer: 59

## 2023-01-17 ENCOUNTER — Emergency Department (HOSPITAL_BASED_OUTPATIENT_CLINIC_OR_DEPARTMENT_OTHER)
Admission: EM | Admit: 2023-01-17 | Discharge: 2023-01-17 | Disposition: A | Discharge status: Home / Self Care | Payer: 59 | Attending: Emergency Medicine | Admitting: Emergency Medicine

## 2023-01-17 DIAGNOSIS — M542 Cervicalgia: Secondary | ICD-10-CM | POA: Diagnosis not present

## 2023-01-17 DIAGNOSIS — S0990XA Unspecified injury of head, initial encounter: Secondary | ICD-10-CM | POA: Insufficient documentation

## 2023-01-17 DIAGNOSIS — W19XXXA Unspecified fall, initial encounter: Secondary | ICD-10-CM | POA: Insufficient documentation

## 2023-01-17 MED ORDER — ACETAMINOPHEN 500 MG PO TABS
1000.0000 mg | ORAL_TABLET | Freq: Once | ORAL | Status: AC
  Administered 2023-01-17: 1000 mg via ORAL
  Filled 2023-01-17: qty 2

## 2023-01-17 NOTE — ED Triage Notes (Signed)
Mechanical fall today. Struck back of head. Denies loc. Denies blood thinners.

## 2023-01-17 NOTE — ED Provider Notes (Signed)
[place] [facility] Provider Note   CSN: [id number] Arrival date & time: 01/17/23  2001     History Chief Complaint  Patient presents with   Fall   Head Injury    HPI Heidi Navarro is a 52 y.o. female presenting for chief complaint of neck pain.  States that she got into a rolling chair and lost control of hit her neck on a countertop. Endorses pain in her head pain in her neck.  Denies fevers chills nausea vomiting shortness of breath..   Patient's recorded medical, surgical, social, medication list and allergies were reviewed in the Snapshot window as part of the initial history.   Review of Systems   Review of Systems  Constitutional:  Negative for chills and fever.  HENT:  Negative for ear pain and sore throat.   Eyes:  Negative for pain and visual disturbance.  Respiratory:  Negative for cough and shortness of breath.   Cardiovascular:  Negative for chest pain and palpitations.  Gastrointestinal:  Negative for abdominal pain and vomiting.  Genitourinary:  Negative for dysuria and hematuria.  Musculoskeletal:  Positive for neck pain. Negative for arthralgias and back pain.  Skin:  Negative for color change and rash.  Neurological:  Positive for headaches. Negative for seizures and syncope.  All other systems reviewed and are negative.   Physical Exam Updated Vital Signs BP (!) 161/78 (BP Location: Left Arm)   Pulse (!) 56   Temp 97.9 F (36.6 C)   Resp 18   Ht 5\' 5"  (1.651 m)   Wt 136.1 kg   SpO2 100%   BMI 49.92 kg/m  Physical Exam Vitals and nursing note reviewed.  Constitutional:      General: She is not in acute distress.    Appearance: She is well-developed.  HENT:     Head: Normocephalic and atraumatic.  Eyes:     Conjunctiva/sclera: Conjunctivae normal.  Cardiovascular:     Rate and Rhythm: Normal rate and regular rhythm.     Heart sounds: No murmur heard. Pulmonary:     Effort: Pulmonary effort is normal.  No respiratory distress.     Breath sounds: Normal breath sounds.  Abdominal:     General: There is no distension.     Palpations: Abdomen is soft.     Tenderness: There is no abdominal tenderness. There is no right CVA tenderness or left CVA tenderness.  Musculoskeletal:        General: No swelling or tenderness. Normal range of motion.     Cervical back: Neck supple.  Skin:    General: Skin is warm and dry.  Neurological:     General: No focal deficit present.     Mental Status: She is alert and oriented to person, place, and time. Mental status is at baseline.     Cranial Nerves: No cranial nerve deficit.      ED Course/ Medical Decision Making/ A&P    Procedures Procedures   Medications Ordered in ED Medications  acetaminophen (TYLENOL) tablet 1,000 mg (1,000 mg Oral Given 01/17/23 2100)   Medical Decision Making:    Heidi Navarro is a 52 y.o. female who presented to the ED today with a moderate mechanisma trauma, detailed above.    Given this mechanism of trauma, a full physical exam was performed. Notably, patient was HDS in NAD.   Reviewed and confirmed nursing documentation for past medical history, family history, social history.    Initial Assessment/Plan:  This is a patient presenting with a moderate mechanism trauma.  As such, I have considered intracranial injuries including intracranial hemorrhage, intrathoracic injuries including blunt myocardial or blunt lung injury, blunt abdominal injuries including aortic dissection, bladder injury, spleen injury, liver injury and I have considered orthopedic injuries including extremity or spinal injury.  With the patient's presentation of moderate mechanism trauma but an otherwise reassuring exam, patient warrants targeted evaluation for potential traumatic injuries. Will proceed with targeted evaluation for potential injuries. Will proceed with CTH/CTCsp. Objective evaluation resulted with NAA.   Final Reassessment and  Plan:   Patient is present on his physical exam findings in the context these objective findings do not reveal any acute pathology.  Given only mild discomfort, patient stable for outpatient care management.  Recommended follow-up with PCP for ongoing musculoskeletal pain.   Disposition:  I have considered need for hospitalization, however, considering all of the above, I believe this patient is stable for discharge at this time.  Patient/family educated about specific return precautions for given chief complaint and symptoms.  Patient/family educated about follow-up with PCP .     Patient/family expressed understanding of return precautions and need for follow-up. Patient spoken to regarding all imaging and laboratory results and appropriate follow up for these results. All education provided in verbal form with additional information in written form. Time was allowed for answering of patient questions. Patient discharged.    Emergency Department Medication Summary:   Medications  acetaminophen (TYLENOL) tablet 1,000 mg (1,000 mg Oral Given 01/17/23 2100)           Clinical Impression:  1. Fall, initial encounter      Discharge   Final Clinical Impression(s) / ED Diagnoses Final diagnoses:  Fall, initial encounter    Rx / DC Orders ED Discharge Orders     None         Glyn Ade, MD 01/17/23 2134

## 2023-04-26 IMAGING — CT CT ANGIO CHEST
2 of 6 series · 18 of 36 positions shown · IV contrast (omnipaque)
Comparison: Chest x-ray from the previous day.

CLINICAL DATA: Shortness of breath for 2 days, negative COVID test,
initial encounter

EXAM:
CT ANGIOGRAPHY CHEST WITH CONTRAST
TECHNIQUE: Multidetector CT imaging of the chest was performed using the
standard protocol during bolus administration of intravenous
contrast. Multiplanar CT image reconstructions and MIPs were
obtained to evaluate the vascular anatomy.
CONTRAST:  80mL OMNIPAQUE IOHEXOL 350 MG/ML SOLN

[Series 8: thins · axial · 0.62mm/px · z∈[-364,-156]mm · 17 of 236 slices shown]
[im 14/236  lung]
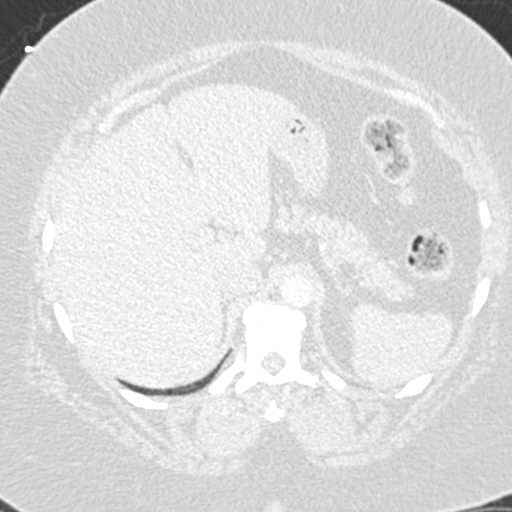
[im 27/236  mediastinal]
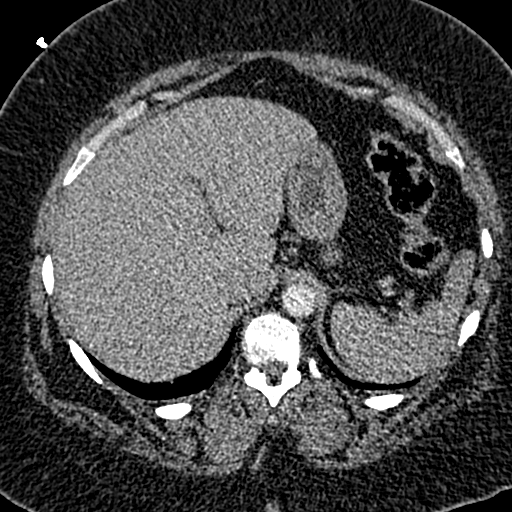
[im 40/236  lung]
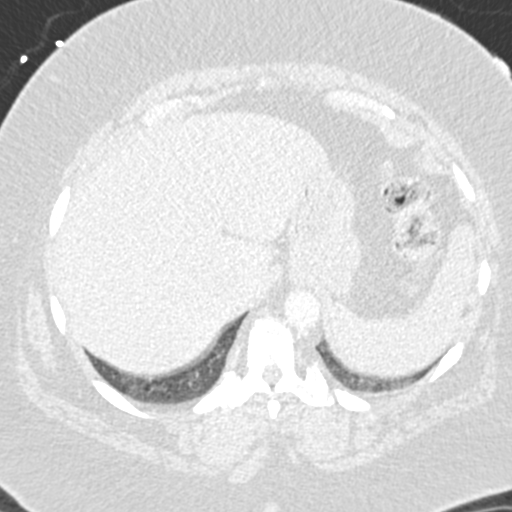
[im 53/236  mediastinal]
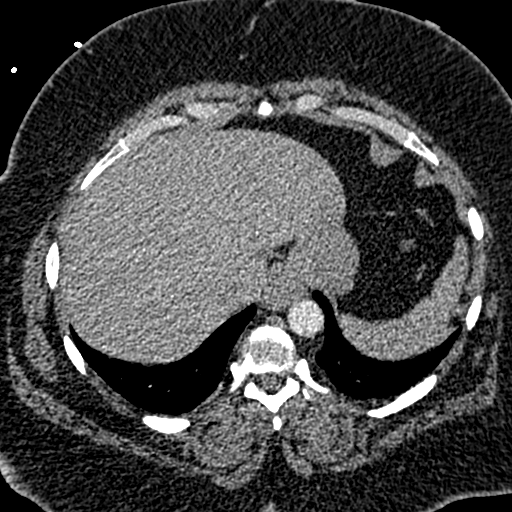
[im 66/236  lung]
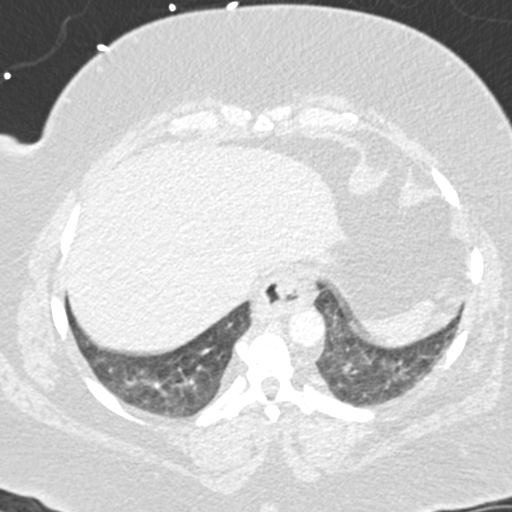
[im 79/236  mediastinal]
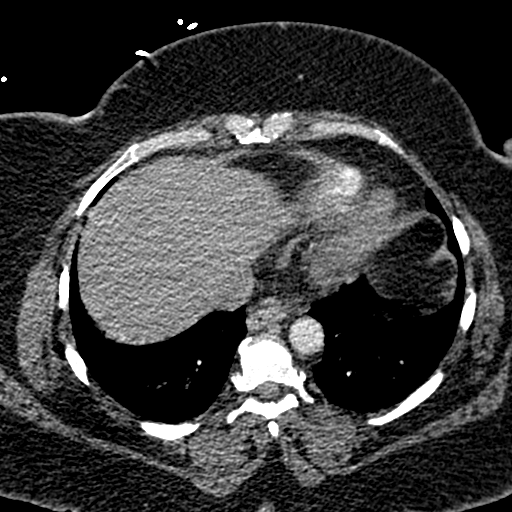
[im 92/236  lung]
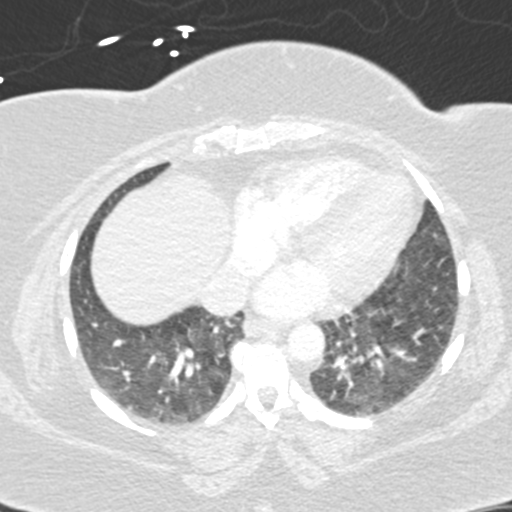
[im 105/236  mediastinal]
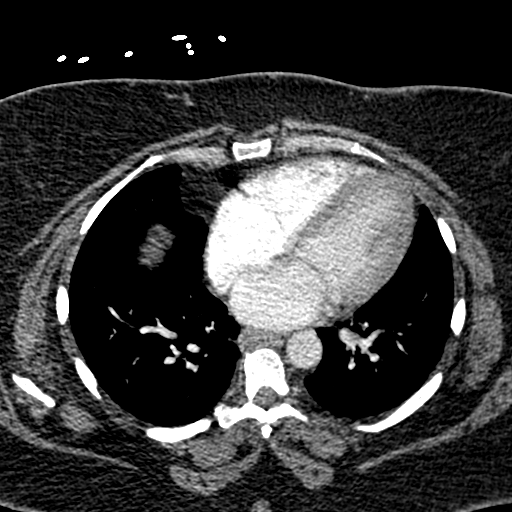
[im 118/236  lung]
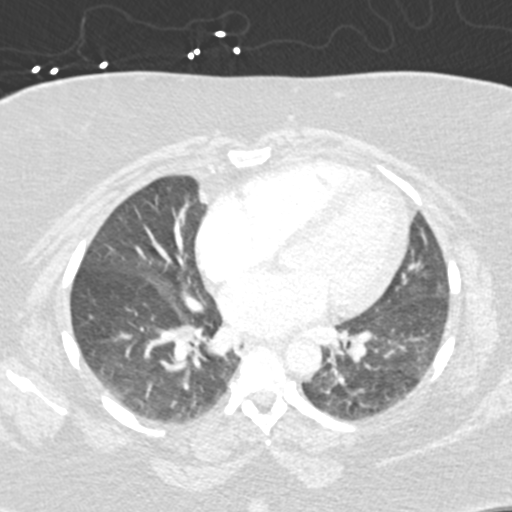
[im 131/236  mediastinal]
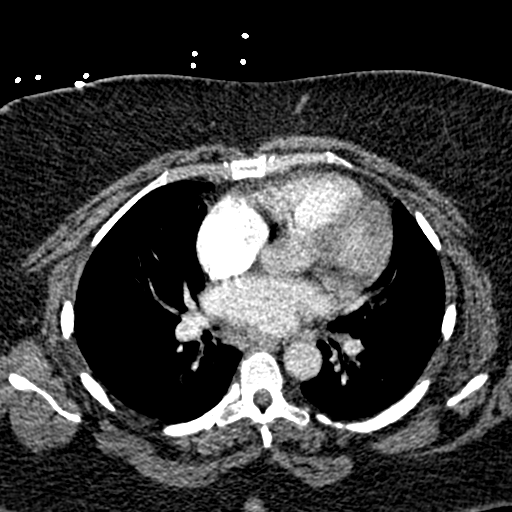
[im 144/236  lung]
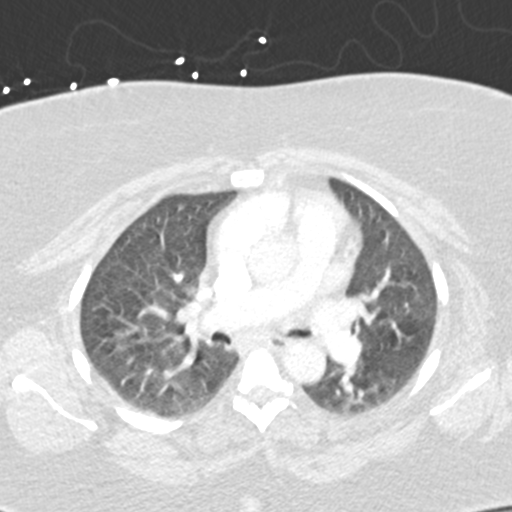
[im 157/236  mediastinal]
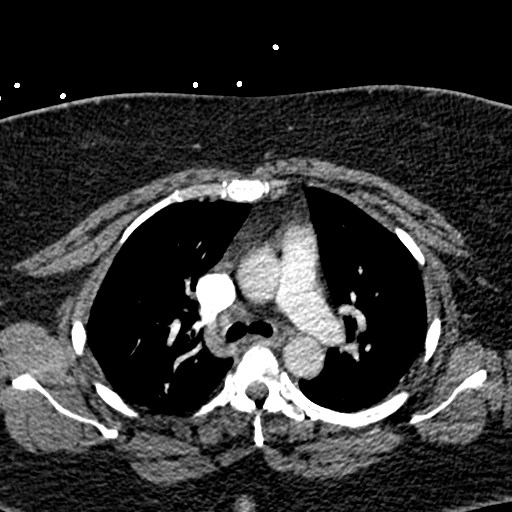
[im 170/236  lung]
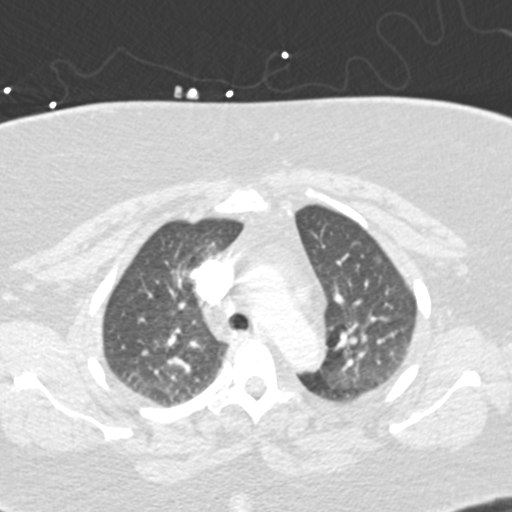
[im 183/236  mediastinal]
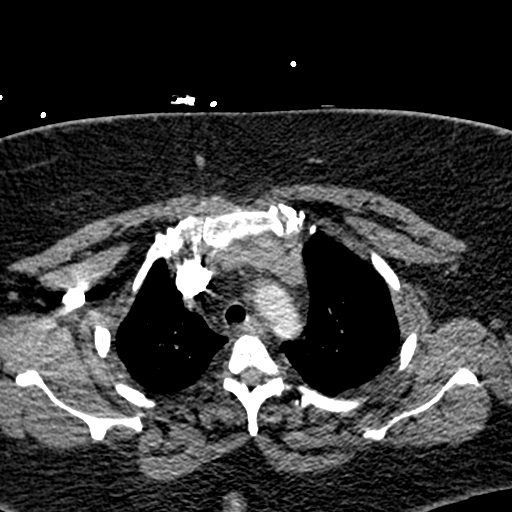
[im 196/236  lung]
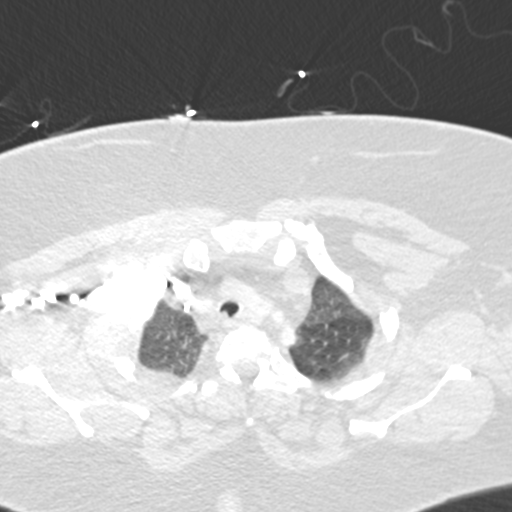
[im 209/236  mediastinal]
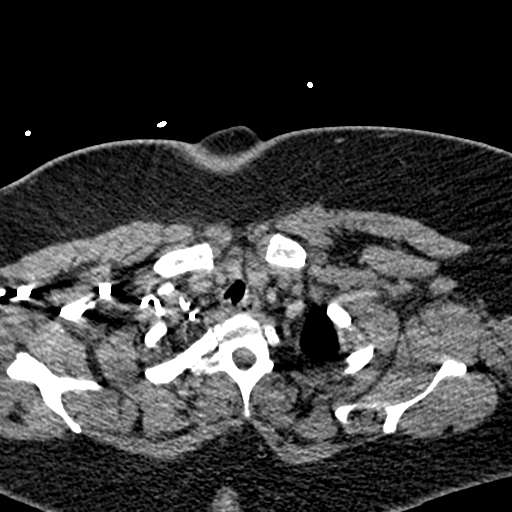
[im 222/236  lung]
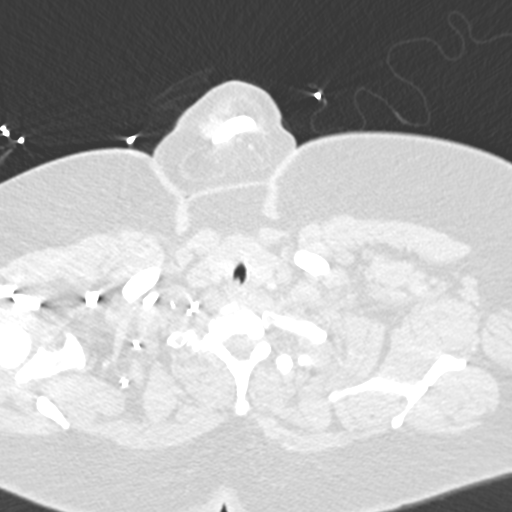

[Series 10: coronal mpr · coronal · 0.45mm/px · 1 of 151 slices shown]
[im 76/151  mediastinal]
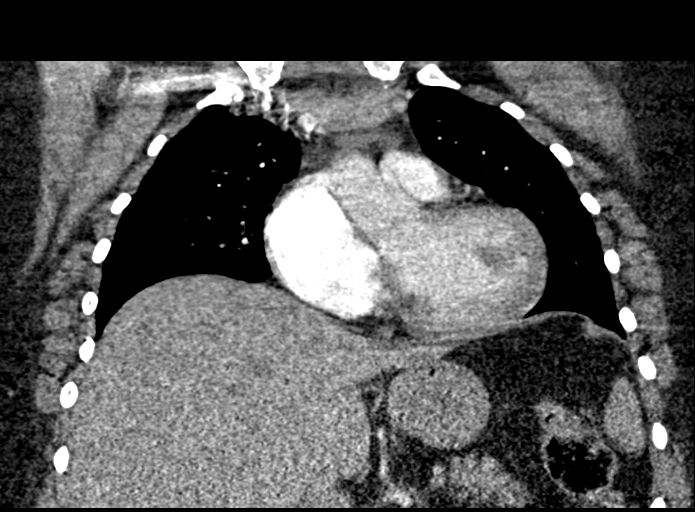

[18 of 36 positions shown; findings below may reference images not displayed]

FINDINGS: Cardiovascular: Thoracic aorta shows no aneurysmal dilatation. No
cardiac enlargement is noted. Pulmonary artery shows no branching
pattern bilaterally. No definitive filling defect to suggest
pulmonary embolism is noted.

Mediastinum/Nodes: Thoracic inlet is within normal limits. No
sizable hilar or mediastinal adenopathy is noted. The esophagus as
visualized shows a small sliding-type hiatal hernia.

Lungs/Pleura: Mild dependent atelectatic changes are seen. No focal
confluent infiltrate is noted.

Upper Abdomen: Visualized upper abdomen is within normal limits.

Musculoskeletal: Degenerative changes of the thoracic spine are
seen. No rib abnormality is noted.

Review of the MIP images confirms the above findings.
IMPRESSION: No evidence of pulmonary embolism.

Mild dependent atelectatic changes.
